# Patient Record
Sex: Female | Born: 1975 | Hispanic: Yes | Marital: Married | Smoking: Never smoker
Health system: Southern US, Community
[De-identification: ages and names within clinical notes are randomized; demographics above are authoritative.]

---

## 1998-02-14 ENCOUNTER — Encounter: Admission: RE | Admit: 1998-02-14 | Discharge: 1998-05-15 | Payer: Self-pay | Admitting: Gynecology

## 1998-07-22 ENCOUNTER — Inpatient Hospital Stay (HOSPITAL_COMMUNITY): Admission: AD | Admit: 1998-07-22 | Discharge: 1998-07-24 | Payer: Self-pay | Admitting: Gynecology

## 1999-05-04 ENCOUNTER — Inpatient Hospital Stay (HOSPITAL_COMMUNITY): Admission: AD | Admit: 1999-05-04 | Discharge: 1999-05-04 | Payer: Self-pay | Admitting: Gynecology

## 1999-08-28 ENCOUNTER — Other Ambulatory Visit: Admission: RE | Admit: 1999-08-28 | Discharge: 1999-08-28 | Payer: Self-pay | Admitting: Gynecology

## 1999-11-06 ENCOUNTER — Inpatient Hospital Stay (HOSPITAL_COMMUNITY): Admission: AD | Admit: 1999-11-06 | Discharge: 1999-11-08 | Payer: Self-pay | Admitting: Gynecology

## 1999-12-20 ENCOUNTER — Other Ambulatory Visit: Admission: RE | Admit: 1999-12-20 | Discharge: 1999-12-20 | Payer: Self-pay | Admitting: Gynecology

## 2001-01-29 ENCOUNTER — Other Ambulatory Visit: Admission: RE | Admit: 2001-01-29 | Discharge: 2001-01-29 | Payer: Self-pay | Admitting: Gynecology

## 2002-03-28 ENCOUNTER — Other Ambulatory Visit: Admission: RE | Admit: 2002-03-28 | Discharge: 2002-03-28 | Payer: Self-pay | Admitting: Gynecology

## 2003-03-30 ENCOUNTER — Other Ambulatory Visit: Admission: RE | Admit: 2003-03-30 | Discharge: 2003-03-30 | Payer: Self-pay | Admitting: Gynecology

## 2004-02-19 ENCOUNTER — Ambulatory Visit (HOSPITAL_COMMUNITY): Admission: RE | Admit: 2004-02-19 | Discharge: 2004-02-19 | Payer: Self-pay | Admitting: Gastroenterology

## 2004-02-19 ENCOUNTER — Encounter (INDEPENDENT_AMBULATORY_CARE_PROVIDER_SITE_OTHER): Payer: Self-pay | Admitting: *Deleted

## 2004-02-26 ENCOUNTER — Ambulatory Visit (HOSPITAL_COMMUNITY): Admission: RE | Admit: 2004-02-26 | Discharge: 2004-02-26 | Payer: Self-pay | Admitting: Gastroenterology

## 2004-05-01 ENCOUNTER — Other Ambulatory Visit: Admission: RE | Admit: 2004-05-01 | Discharge: 2004-05-01 | Payer: Self-pay | Admitting: Gynecology

## 2005-04-07 ENCOUNTER — Ambulatory Visit: Payer: Self-pay | Admitting: Family Medicine

## 2005-06-25 ENCOUNTER — Ambulatory Visit: Payer: Self-pay | Admitting: Family Medicine

## 2005-07-27 ENCOUNTER — Ambulatory Visit: Payer: Self-pay | Admitting: *Deleted

## 2006-10-13 ENCOUNTER — Ambulatory Visit: Payer: Self-pay | Admitting: Family Medicine

## 2006-10-13 ENCOUNTER — Encounter (INDEPENDENT_AMBULATORY_CARE_PROVIDER_SITE_OTHER): Payer: Self-pay | Admitting: Family Medicine

## 2007-06-02 ENCOUNTER — Encounter (INDEPENDENT_AMBULATORY_CARE_PROVIDER_SITE_OTHER): Payer: Self-pay | Admitting: *Deleted

## 2008-01-26 ENCOUNTER — Ambulatory Visit: Payer: Self-pay | Admitting: Internal Medicine

## 2008-06-21 ENCOUNTER — Ambulatory Visit: Payer: Self-pay | Admitting: Family Medicine

## 2008-07-20 ENCOUNTER — Ambulatory Visit: Payer: Self-pay | Admitting: Family Medicine

## 2008-08-29 ENCOUNTER — Ambulatory Visit: Payer: Self-pay | Admitting: Family Medicine

## 2008-09-19 ENCOUNTER — Ambulatory Visit: Payer: Self-pay | Admitting: Internal Medicine

## 2008-09-19 ENCOUNTER — Encounter (INDEPENDENT_AMBULATORY_CARE_PROVIDER_SITE_OTHER): Payer: Self-pay | Admitting: Family Medicine

## 2008-09-19 LAB — CONVERTED CEMR LAB
ALT: 23 units/L (ref 0–35)
AST: 27 units/L (ref 0–37)
Albumin: 4.3 g/dL (ref 3.5–5.2)
Alkaline Phosphatase: 58 units/L (ref 39–117)
BUN: 14 mg/dL (ref 6–23)
Eosinophils Absolute: 0.1 10*3/uL (ref 0.0–0.7)
Eosinophils Relative: 1 % (ref 0–5)
Glucose, Bld: 78 mg/dL (ref 70–99)
Hemoglobin: 12.1 g/dL (ref 12.0–15.0)
LDL Cholesterol: 126 mg/dL — ABNORMAL HIGH (ref 0–99)
Lymphocytes Relative: 24 % (ref 12–46)
Monocytes Absolute: 0.4 10*3/uL (ref 0.1–1.0)
Platelets: 214 10*3/uL (ref 150–400)
Potassium: 3.6 meq/L (ref 3.5–5.3)
RBC: 4.43 M/uL (ref 3.87–5.11)
RDW: 14.2 % (ref 11.5–15.5)
Sodium: 139 meq/L (ref 135–145)
Total Bilirubin: 0.4 mg/dL (ref 0.3–1.2)
VLDL: 10 mg/dL (ref 0–40)

## 2008-09-28 ENCOUNTER — Encounter (INDEPENDENT_AMBULATORY_CARE_PROVIDER_SITE_OTHER): Payer: Self-pay | Admitting: Family Medicine

## 2008-09-28 ENCOUNTER — Ambulatory Visit: Payer: Self-pay | Admitting: Family Medicine

## 2008-09-28 LAB — CONVERTED CEMR LAB
BUN: 12 mg/dL (ref 6–23)
Basophils Absolute: 0 10*3/uL (ref 0.0–0.1)
CO2: 23 meq/L (ref 19–32)
Chlamydia, DNA Probe: NEGATIVE
Creatinine, Ser: 0.46 mg/dL (ref 0.40–1.20)
Eosinophils Absolute: 0 10*3/uL (ref 0.0–0.7)
GC Probe Amp, Genital: NEGATIVE
Glucose, Bld: 86 mg/dL (ref 70–99)
HCT: 40 % (ref 36.0–46.0)
Hemoglobin: 12.5 g/dL (ref 12.0–15.0)
Lymphocytes Relative: 25 % (ref 12–46)
Lymphs Abs: 1.1 10*3/uL (ref 0.7–4.0)
MCHC: 31.3 g/dL (ref 30.0–36.0)
MCV: 86.2 fL (ref 78.0–100.0)
Neutrophils Relative %: 65 % (ref 43–77)
Potassium: 3.6 meq/L (ref 3.5–5.3)
RBC: 4.64 M/uL (ref 3.87–5.11)
Sodium: 140 meq/L (ref 135–145)
Total Protein: 7.4 g/dL (ref 6.0–8.3)
WBC: 4.2 10*3/uL (ref 4.0–10.5)

## 2008-10-06 ENCOUNTER — Ambulatory Visit (HOSPITAL_COMMUNITY): Admission: RE | Admit: 2008-10-06 | Discharge: 2008-10-06 | Payer: Self-pay | Admitting: Family Medicine

## 2008-11-02 ENCOUNTER — Ambulatory Visit: Payer: Self-pay | Admitting: Family Medicine

## 2009-02-01 ENCOUNTER — Ambulatory Visit: Payer: Self-pay | Admitting: Family Medicine

## 2009-04-22 IMAGING — US US TRANSVAGINAL NON-OB
1 series · 13 of 25 positions shown · non-contrast
Comparison: None

CLINICAL DATA: Pelvic pain.  LMP 09/13/2008

TRANSABDOMINAL AND TRANSVAGINAL ULTRASOUND OF PELVIS
TECHNIQUE: Both transabdominal and transvaginal ultrasound
examinations of the pelvis were performed including evaluation of
the uterus, ovaries, adnexal regions, and pelvic cul-de-sac.

[Series 1: unknown · 0.32mm/px · 13 of 49 slices shown]
[im 1/49]
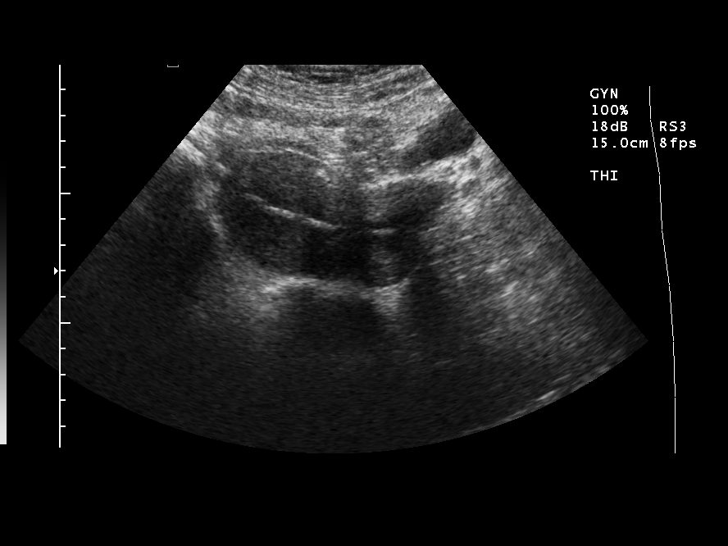
[im 5/49]
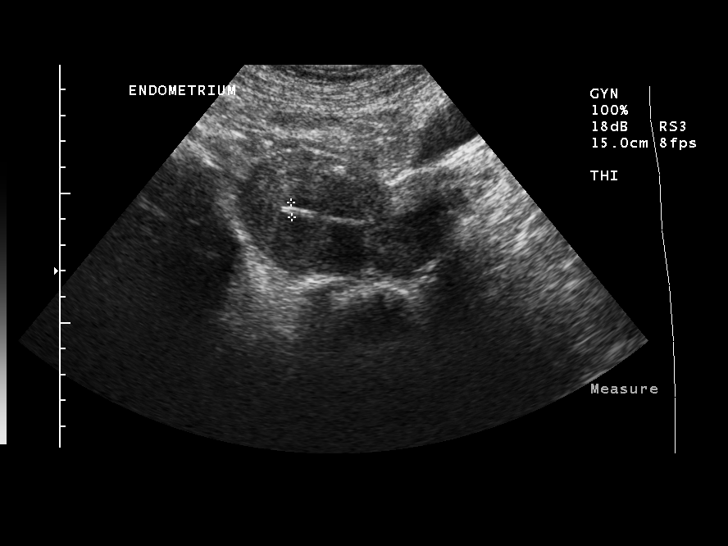
[im 9/49]
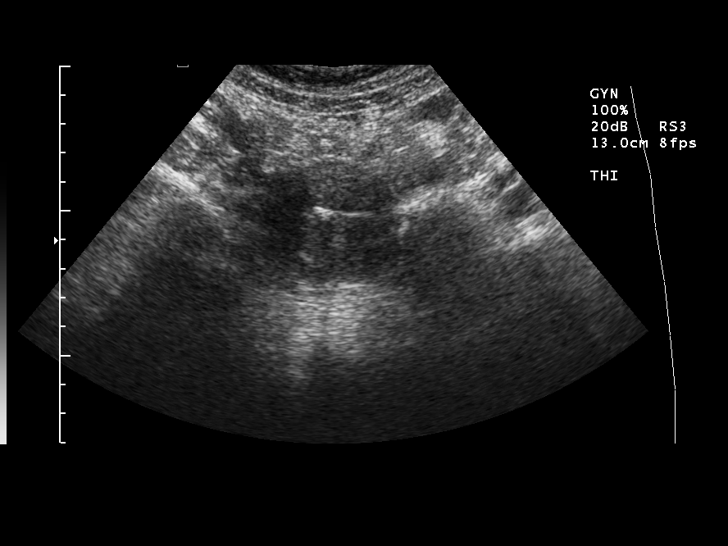
[im 13/49]
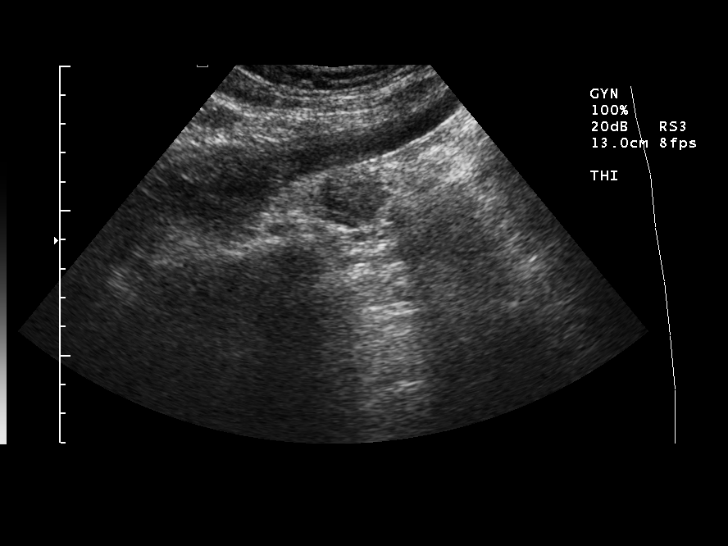
[im 17/49]
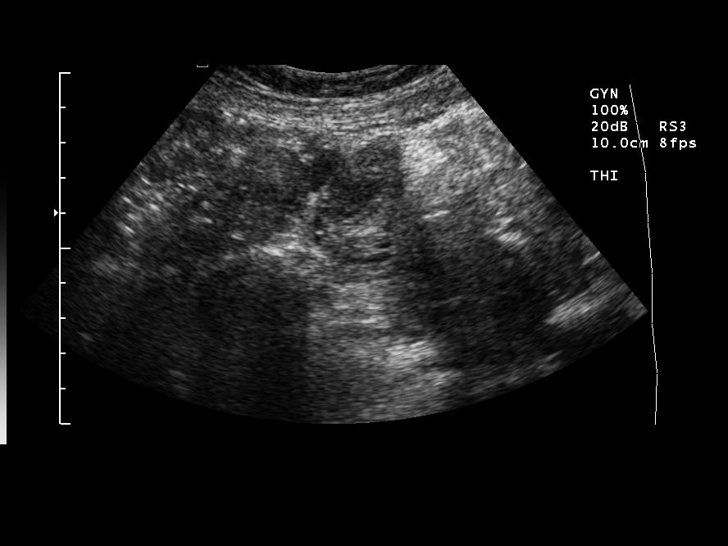
[im 21/49]
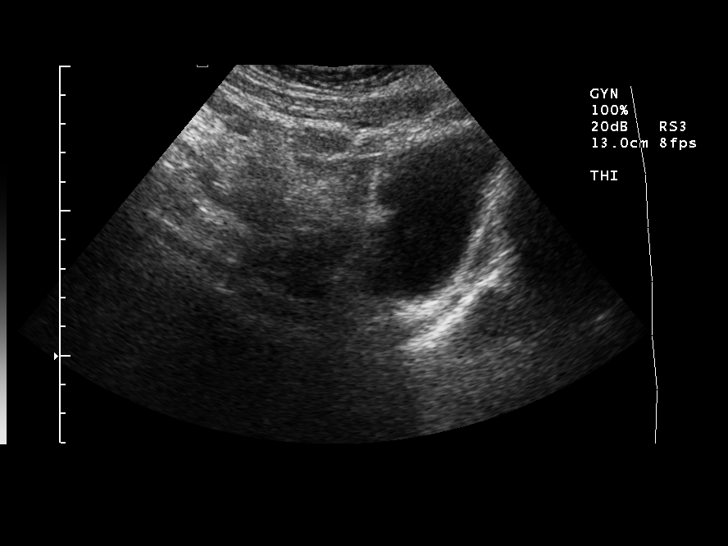
[im 25/49]
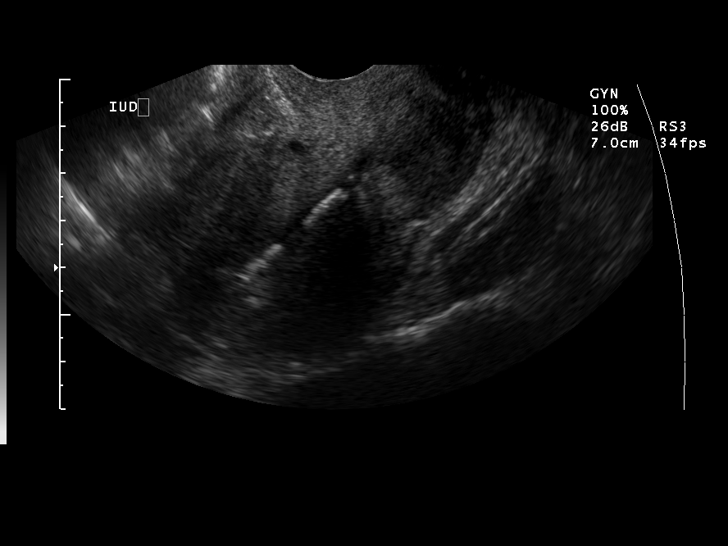
[im 29/49]
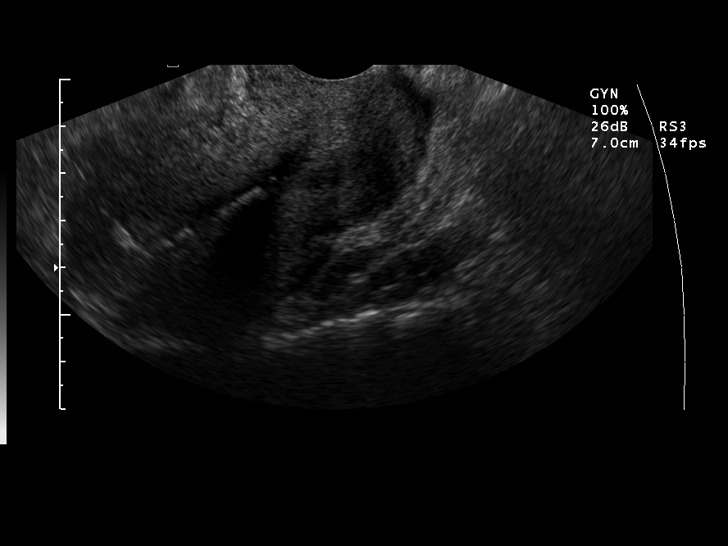
[im 33/49]
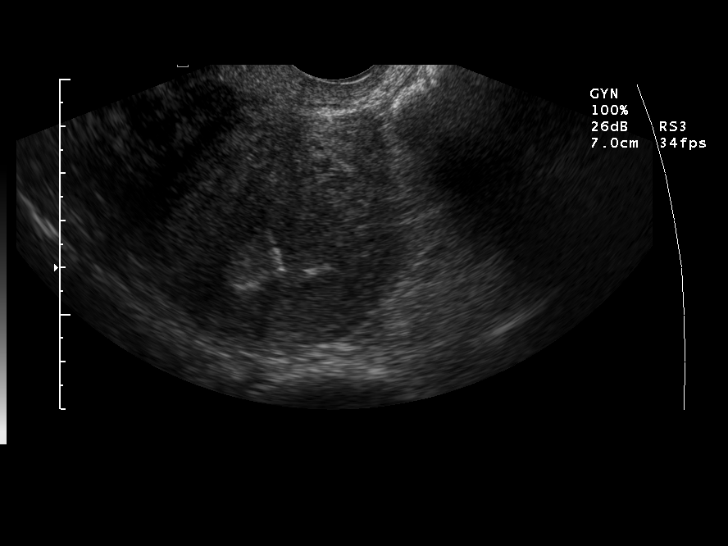
[im 37/49]
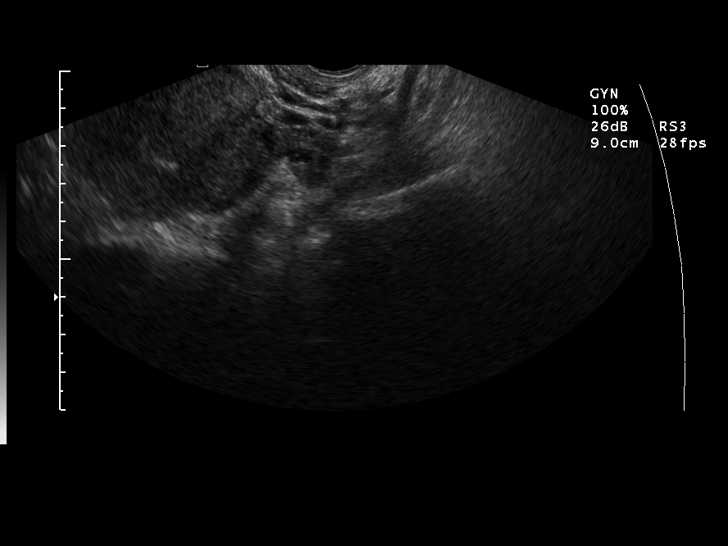
[im 41/49]
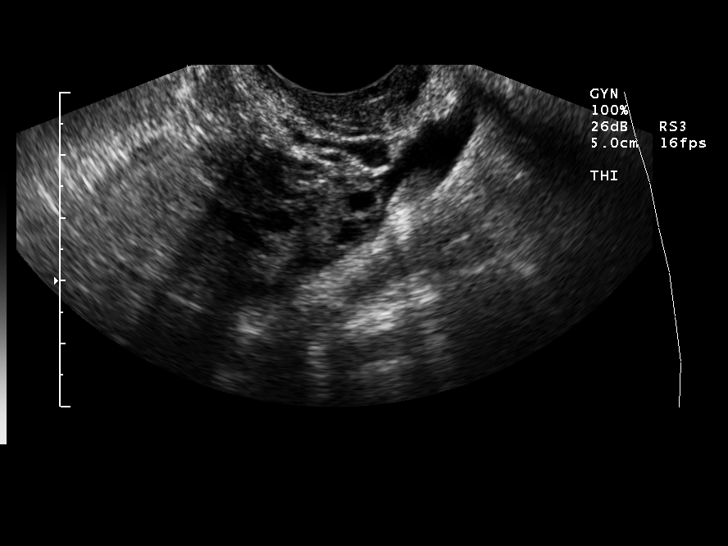
[im 45/49]
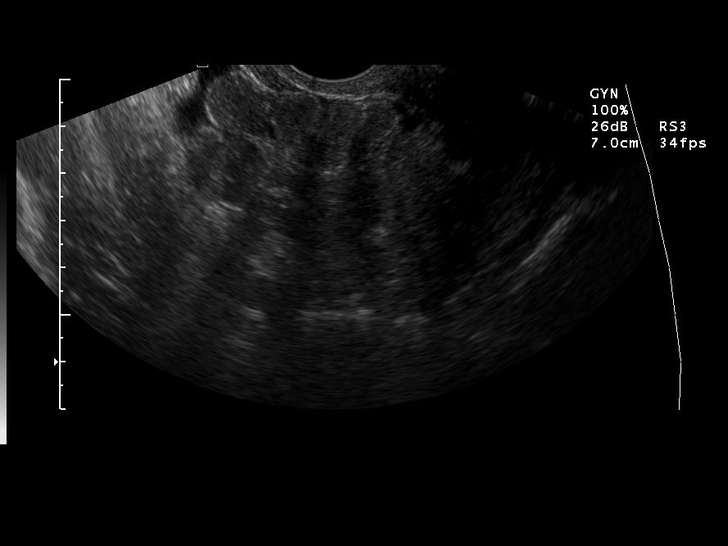
[im 49/49]
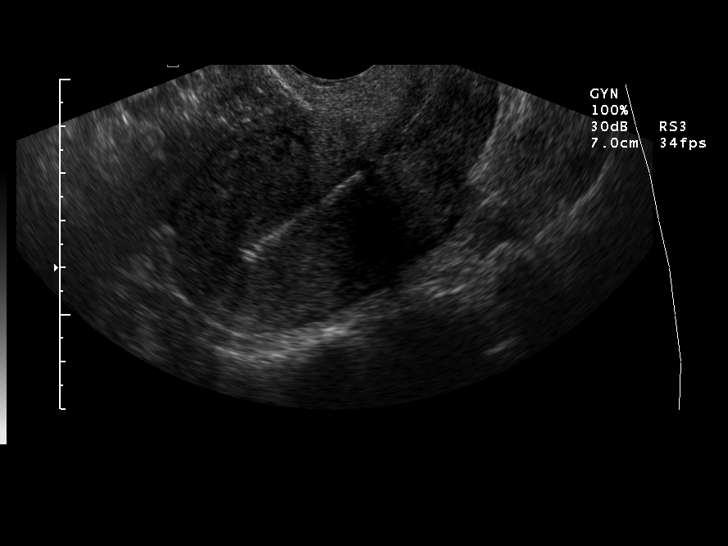

[13 of 25 positions shown; findings below may reference images not displayed]

FINDINGS: The uterus has a sagittal length of 8.2 cm, an AP width
of 4.8 cm and a transverse width of 5.8 cm.  A homogeneous uterine
myometrium is seen.  An IUD is in place within the endometrial
canal with the fundal portion of the IUD within 5 mm of the fundal
portion of the endometrial lining.  The endometrial lining is
incompletely assessed due to the presence of shadowing from the
IUD.  No definite areas of focal thickening however are noted.

Both ovaries have a normal appearance with the left ovary measuring
1.3 x 2.9 x 1.3 cm and the right ovary measuring 3.3 x 2.6 x
cm.  The right ovary is best visualized transabdominally.

A small amount of simple free fluid is identified in a left
paraovarian location.  No separate adnexal masses are seen.
IMPRESSION: IUD in place within the endometrial canal as described above.  Poor
endometrial evaluation due to the presence of the IUD.  Otherwise
normal pelvic ultrasound.

## 2009-06-14 ENCOUNTER — Ambulatory Visit: Payer: Self-pay | Admitting: Family Medicine

## 2010-02-05 ENCOUNTER — Ambulatory Visit: Payer: Self-pay | Admitting: Internal Medicine

## 2010-10-06 ENCOUNTER — Encounter: Payer: Self-pay | Admitting: Gastroenterology

## 2011-01-31 NOTE — Discharge Summary (Signed)
[facility]  Patient:    Jennifer Montoya, Jennifer Montoya                       MRN: [id number] Adm. Date:  [id number] Disc. Date: [id number] Attending:  Tonye Royalty Dictator:   Antony Contras, RNC, [facility]                           Discharge Summary  DISCHARGE DIAGNOSES:          1. Intrauterine pregnancy at 38-4/7 weeks.                               2. Normal spontaneous vaginal delivery of a viable                                  female infant.  HISTORY OF PRESENT ILLNESS:   The patient is a 35 year old, gravida 3, para 1, abortus 1, with an EDC of November 15, 1999.  Prenatal course was complicated by a Bartholins gland abscess which required incision and drainage and placement of  Ward catheter at approximately [redacted] weeks gestation.  Prenatal blood work was as follows.  PRENATAL LABORATORY DATA:     Blood type is A positive, antibody screen negative, rubella positive, MSAFP within normal limits, negative Group B Strep status.  HOSPITAL COURSE:              The patient presented in labor on November 06, 1999. On admission the cervix was 4 to 5 cm, 100% effaced.  Labor progressed to complete dilatation.  She was delivered of a viable female infant, Apgars 8 and 9 over midline episiotomy without extension.  Placenta intact with three vessel cord. Estimated blood loss was 300 cc.  Postpartum course was uneventful.  She remained afebrile with no problems voiding.  She was able to be discharged on her second  postpartum day with her infant in satisfactory condition.  DISPOSITION:                  She will follow up in the office in four to six weeks. She was discharged on Motrin p.r.n. pain and to continue with prenatal vitamins. DD:  11/26/99 TD:  11/26/99 Job: [id number]

## 2011-01-31 NOTE — Op Note (Signed)
NAME:  Jennifer Montoya, Jennifer Montoya                          ACCOUNT NO.:  [id number]   MEDICAL RECORD NO.:  [id number]                   PATIENT TYPE:  AMB   LOCATION:  ENDO                                 FACILITY:  [facility]   PHYSICIAN:  Anselmo Rod, M.D.               DATE OF BIRTH:  1976/03/11   DATE OF PROCEDURE:  02/19/2004  DATE OF DISCHARGE:                                 OPERATIVE REPORT   PROCEDURE:  Esophagogastroduodenoscopy with biopsy.   ENDOSCOPIST:  Charna Elizabeth, M.D.   INSTRUMENT USED:  Olympus video panendoscope.   INDICATIONS FOR PROCEDURE:  35 year old Hispanic female with a history of  epigastric pain and hematemesis.  Rule out peptic ulcer disease, AVMs, or  Dieulafoy's lesions.   PREPROCEDURE PREPARATION:  Informed consent was obtained from the patient.  The patient was fasted for eight hours prior to the procedure.   PREPROCEDURE PHYSICAL:  Patient with stable vital signs.  Neck supple.  Chest clear to auscultation.  S1 and S2 regular.  Abdomen soft with normal  bowel sounds.   DESCRIPTION OF PROCEDURE:  The patient was placed in the left lateral  decubitus position, sedated with 75 mg of Demerol and 7.5 mg Versed  intravenously.  Once the patient was adequately sedated, maintained on low  flow oxygen and continuous cardiac monitoring, the Olympus video  panendoscope was advanced through the mouth piece over the tongue into the  esophagus under direct vision.  The entire esophagus appeared normal with no  evidence of ring, strictures, masses, esophagitis, or Barrett's mucosa.  The  scope was then advanced into the stomach.  Hemorrhagic gastritis was noted  in the proximal stomach with old heme and biopsies were done to rule out the  presence of H. pylori pathology.  The mid body and the antrum appeared  normal.  The proximal was essentially normal except for a prominent ampulla  and a small prominent fold near the ampulla that was biopsied x 1.  This  bled easily  and, therefore, no further biopsies were done.  There was no  outlet obstruction.  The patient tolerated the procedure well without  complications.  No definite ulcers, AVMs, masses, or polyps were seen.   IMPRESSION:  1. Hemorrhagic gastritis in the proximal half of the stomach, biopsies done.  2. 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  3. Normal appearing esophagus.  4. No ulcers, erosions, masses, or polyps seen, no evidence of AVMs or     Dieulafoy's lesions.   RECOMMENDATIONS:  1. Upper GI with small bowel follow through has been recommended.  2. Await pathology results, treat with antibiotics is H. pylori present on     biopsy.  3. Avoid nonsteroidals for now.  4. Continue PPIs.  5. Outpatient follow up in the next two weeks, earlier if need be.  Anselmo Rod, M.D.    JNM/MEDQ  D:  02/19/2004  T:  02/19/2004  Job:  [id number]   cc:   Gabriel Earing, M.D.  [address]  [place]  [place] [address]  Fax: [phone]

## 2019-09-07 ENCOUNTER — Ambulatory Visit: Payer: Self-pay | Admitting: Women's Health

## 2019-09-26 ENCOUNTER — Other Ambulatory Visit: Payer: Self-pay

## 2019-09-27 ENCOUNTER — Ambulatory Visit (INDEPENDENT_AMBULATORY_CARE_PROVIDER_SITE_OTHER): Payer: Self-pay | Admitting: Women's Health

## 2019-09-27 ENCOUNTER — Encounter: Payer: Self-pay | Admitting: Women's Health

## 2019-09-27 VITALS — BP 118/76 | Ht 64.0 in | Wt 132.0 lb

## 2019-09-27 DIAGNOSIS — N938 Other specified abnormal uterine and vaginal bleeding: Secondary | ICD-10-CM

## 2019-09-27 DIAGNOSIS — N898 Other specified noninflammatory disorders of vagina: Secondary | ICD-10-CM

## 2019-09-27 DIAGNOSIS — Z113 Encounter for screening for infections with a predominantly sexual mode of transmission: Secondary | ICD-10-CM

## 2019-09-27 DIAGNOSIS — A599 Trichomoniasis, unspecified: Secondary | ICD-10-CM

## 2019-09-27 DIAGNOSIS — Z124 Encounter for screening for malignant neoplasm of cervix: Secondary | ICD-10-CM

## 2019-09-27 DIAGNOSIS — Z01419 Encounter for gynecological examination (general) (routine) without abnormal findings: Secondary | ICD-10-CM

## 2019-09-27 LAB — WET PREP FOR TRICH, YEAST, CLUE

## 2019-09-27 MED ORDER — METRONIDAZOLE 500 MG PO TABS: ORAL_TABLET | ORAL | 0 refills | Status: DC

## 2019-09-27 NOTE — Progress Notes (Signed)
Jennifer Montoya 01/08/1976 [id number]  Spanish interpretor.    History: 44 yo MHF G3P2 Presents for annual exam and vaginal discharge, itching, redness x2 weeks. LMP 09/06/19-09/14/19, new cycle began today. Sexually active with husband x23 years with no contraception, no new partner and husband denies infidelity. Patient and husband diagnosed with trichomonas and gonorrhea 04/2019 at [facility] department and both received treatment.  Negative HIV, hepatitis and syphilis screen does not use condoms. Ectopic pregnancy >10 yrs ago.  Last pap 2017 at health department, reports history of normal paps. No mammogram. No medical problems.   Past medical history, past surgical history, family history and social history were all reviewed and documented in the EPIC chart. 2 healthy children. Sister lives in area, parents live in [place]. Works at [employer]. Feels safe at home.  ROS:  A ROS was performed and pertinent positives and negatives are included.  Exam:  Vitals:   09/27/19 1359  BP: 118/76  Weight: 132 lb (59.9 kg)  Height: 5\' 4"  (1.626 m)   Body mass index is 22.66 kg/m.   General appearance:  Normal Thyroid:  Symmetrical, normal in size, without palpable masses or nodularity. Respiratory  Auscultation:  Clear without wheezing or rhonchi Cardiovascular  Auscultation:  Regular rate, without rubs, murmurs or gallops  Edema/varicosities:  Not grossly evident Abdominal  Soft,nontender, without masses, guarding or rebound.  Liver/spleen:  No organomegaly noted  Hernia:  None appreciated  Skin  Inspection:  Grossly normal   Breasts: Examined lying and sitting.     Right: Without masses, retractions, discharge or axillary adenopathy.     Left: Without masses, retractions, discharge or axillary adenopathy. Gentitourinary   Inguinal/mons:  Normal without inguinal adenopathy  External genitalia:  Normal  BUS/Urethra/Skene's glands:  Normal  Vagina:  Visible menses. Wet prep positive for  trichomonas.   Cervix:  Normal, no CMT  Uterus:  normal in size, shape and contour.  Midline and mobile  Adnexa/parametria:     Rt: Without masses or tenderness.   Lt: Without masses or tenderness.  Anus and perineum: Normal    Assessment/Plan:  44 y.o. MHF G3P2  for annual exam and vaginal discharge, itching, redness.  Trichomonas  STD Screen  Monthly cycle- no contraception  Plan: Metronidazole 500mg  8 tablets, 4 tablets each for her and husband, reviewed alcohol precautions.  Abstain for 1 week and call or return if continued vaginal irritation.  Pap, GC/chlamydia. Reviewed contraception options, does not desire at this time, no pregnancy in past 4 years with no contraception. Encouraged daily exercise, healthy diet, and daily MVI. Informed likelihood that husband was unfaithful, recommended counseling. Given mammogram scholarship paperwork with instructions. Instructed to call if symptoms persist.  Pap.  Requested minimum no insurance    [facility], 2:57 PM 09/27/2019

## 2019-09-27 NOTE — Addendum Note (Signed)
Addended by: Dayna Barker on: 09/27/2019 04:13 PM   Modules accepted: Orders

## 2019-09-27 NOTE — Patient Instructions (Signed)
Mantenimiento de la salud en las mujeres Health Maintenance, Female Adoptar un estilo de vida saludable y recibir atencin preventiva son importantes para promover la salud y el bienestar. Consulte al mdico sobre:  El esquema adecuado para hacerse pruebas y exmenes peridicos.  Cosas que puede hacer por su cuenta para prevenir enfermedades y mantenerse sana. Qu debo saber sobre la dieta, el peso y el ejercicio? Consuma una dieta saludable   Consuma una dieta que incluya muchas verduras, frutas, productos lcteos con bajo contenido de grasa y protenas magras.  No consuma muchos alimentos ricos en grasas slidas, azcares agregados o sodio. Mantenga un peso saludable El ndice de masa muscular (IMC) se utiliza para identificar problemas de peso. Proporciona una estimacin de la grasa corporal basndose en el peso y la altura. Su mdico puede ayudarle a determinar su IMC y a lograr o mantener un peso saludable. Haga ejercicio con regularidad Haga ejercicio con regularidad. Esta es una de las prcticas ms importantes que puede hacer por su salud. La mayora de los adultos deben seguir estas pautas:  Realizar, al menos, 150minutos de actividad fsica por semana. El ejercicio debe aumentar la frecuencia cardaca y hacerlo transpirar (ejercicio de intensidad moderada).  Hacer ejercicios de fortalecimiento por lo menos dos veces por semana. Agregue esto a su plan de ejercicio de intensidad moderada.  Pasar menos tiempo sentados. Incluso la actividad fsica ligera puede ser beneficiosa. Controle sus niveles de colesterol y lpidos en la sangre Comience a realizarse anlisis de lpidos y colesterol en la sangre a los 20aos y luego reptalos cada 5aos. Hgase controlar los niveles de colesterol con mayor frecuencia si:  Sus niveles de lpidos y colesterol son altos.  Es mayor de 40aos.  Presenta un alto riesgo de padecer enfermedades cardacas. Qu debo saber sobre las pruebas de  deteccin del cncer? Segn su historia clnica y sus antecedentes familiares, es posible que deba realizarse pruebas de deteccin del cncer en diferentes edades. Esto puede incluir pruebas de deteccin de lo siguiente:  Cncer de mama.  Cncer de cuello uterino.  Cncer colorrectal.  Cncer de piel.  Cncer de pulmn. Qu debo saber sobre la enfermedad cardaca, la diabetes y la hipertensin arterial? Presin arterial y enfermedad cardaca  La hipertensin arterial causa enfermedades cardacas y aumenta el riesgo de accidente cerebrovascular. Es ms probable que esto se manifieste en las personas que tienen lecturas de presin arterial alta, tienen ascendencia africana o tienen sobrepeso.  Hgase controlar la presin arterial: ? Cada 3 a 5 aos si tiene entre 18 y 39 aos. ? Todos los aos si es mayor de 40aos. Diabetes Realcese exmenes de deteccin de la diabetes con regularidad. Este anlisis revisa el nivel de azcar en la sangre en ayunas. Hgase las pruebas de deteccin:  Cada tresaos despus de los 40aos de edad si tiene un peso normal y un bajo riesgo de padecer diabetes.  Con ms frecuencia y a partir de una edad inferior si tiene sobrepeso o un alto riesgo de padecer diabetes. Qu debo saber sobre la prevencin de infecciones? Hepatitis B Si tiene un riesgo ms alto de contraer hepatitis B, debe someterse a un examen de deteccin de este virus. Hable con el mdico para averiguar si tiene riesgo de contraer la infeccin por hepatitis B. Hepatitis C Se recomienda el anlisis a:  Todos los que nacieron entre 1945 y 1965.  Todas las personas que tengan un riesgo de haber contrado hepatitis C. Enfermedades de transmisin sexual (ETS)  Hgase las   pruebas de deteccin de ITS, incluidas la gonorrea y la clamidia, si: ? Es sexualmente activa y es menor de 62IRS. ? Es mayor de 24aos, y Public affairs consultant informa que corre riesgo de tener este tipo de infecciones. ?  La actividad sexual ha cambiado desde que le hicieron la ltima prueba de deteccin y tiene un riesgo mayor de Warehouse manager clamidia o Copy. Pregntele al mdico si usted tiene riesgo.  Pregntele al mdico si usted tiene un alto riesgo de Primary school teacher VIH. El mdico tambin puede recomendarle un medicamento recetado para ayudar a evitar la infeccin por el VIH. Si elige tomar medicamentos para prevenir el VIH, primero debe [employer] de deteccin del VIH. Luego debe hacerse anlisis cada mientras est tomando los medicamentos. Embarazo  Si est por dejar de Armed forces training and education officer (fase premenopusica) y usted puede quedar [place], busque asesoramiento antes de [place].  Tome de 400 a (mcg) de cido [employer] si [place].  Pida mtodos de control de la natalidad (anticonceptivos) si desea evitar un embarazo no deseado. Osteoporosis y [place] La osteoporosis es una enfermedad en la que los huesos pierden los minerales y la fuerza por el avance de la edad. El resultado pueden ser fracturas en los [place]. Si tiene 65aos o ms, o si est en riesgo de sufrir osteoporosis y fracturas, pregunte a su mdico si debe:  Hacerse pruebas de deteccin de prdida sea.  Tomar un suplemento de calcio o de vitamina D para reducir el riesgo de fracturas.  Recibir terapia de reemplazo hormonal (TRH) para tratar los sntomas de la menopausia. Siga estas instrucciones en su casa: Estilo de vida  No consuma ningn producto que contenga nicotina o tabaco, como cigarrillos, cigarrillos electrnicos y tabaco de Theatre manager. Si necesita ayuda para dejar de fumar, consulte al mdico.  No consuma drogas.  No comparta agujas.  Solicite ayuda a su mdico si necesita apoyo o informacin para abandonar las drogas. Consumo de alcohol  No beba alcohol si: ? Su mdico le indica no hacerlo. ? Est embarazada, puede estar embarazada o est tratando de quedar embarazada.   Si bebe alcohol: ? Limite la cantidad que consume de 0 a 1 medida por da. ? Limite la ingesta si est amamantando.  Est atento a la cantidad de alcohol que hay en las bebidas que toma. En los [place], una medida equivale a una botella de cerveza de 12oz ( ), un vaso de vino de 5oz ( ) o un vaso de una bebida alcohlica de alta graduacin de 1oz (20ml). Instrucciones generales  Realcese los estudios de rutina de la salud, dentales y de Wellsite geologist.  Mantngase al da con las vacunas.  Infrmele a su mdico si: ? Se siente deprimida con frecuencia. ? Alguna vez ha sido vctima de [place] o no se siente segura en su casa. Resumen  Adoptar un estilo de vida saludable y recibir atencin preventiva son importantes para promover la salud y Counsellor.  Siga las instrucciones del mdico acerca de una dieta saludable, el ejercicio y la realizacin de pruebas o exmenes para Hotel manager.  Siga las instrucciones del mdico con respecto al control del colesterol y la presin arterial. Esta informacin no tiene Theme park manager el consejo del mdico. Asegrese de hacerle al mdico cualquier pregunta que tenga. Document Revised: 09/22/2018 Document Reviewed: 09/22/2018 Elsevier Patient Education  2020 [employer]. Tricomoniasis Trichomoniasis La tricomoniasis es una ITS (infeccin de transmisin sexual) que puede afectar tanto a mujeres como  a hombres. En las mujeres, afecta la zona externa de los genitales femeninos (vulva) y la vagina. En los hombres, afecta principalmente al pene, pero tambin pueden estar involucrados la prstata y otros rganos genitales.  Esta afeccin puede tratarse con medicamentos. No suele causar sntomas (es asintomtica), especialmente en los hombres. Si no se trata, la tricomoniasis puede durar meses o aos. Cules son las causas? Esta afeccin es causada por un parsito llamado tricomona vaginal. Es frecuente que la tricomoniasis  se transmita de Ardelia Mems persona a la otra (es contagiosa) a travs del contacto sexual. Sander Nephew incrementa el riesgo? Los siguientes factores pueden hacer que sea ms propensa a desarrollar esta afeccin:  Armed forces operational officer sexuales sin proteccin.  Tener relaciones sexuales con una persona que tenga tricomoniasis.  Tener mltiples parejas sexuales.  Haber tenido infecciones anteriores por tricomoniasis u otras infecciones de transmisin sexual (ITS). Cules son los signos o los sntomas? En las mujeres, los sntomas de tricomoniasis incluyen lo siguiente:  Secrecin vaginal anmala que es transparente o de color blanco, gris o amarillo verdoso, con espuma y un olor ftido inusual.  Picazn e irritacin en la vagina y la vulva.  Dolor o ardor al Garment/textile technologist o al [employer].  Enrojecimiento e hinchazn de los genitales. En los hombres, los sntomas de tricomoniasis incluyen lo siguiente:  Soil scientist del pene que puede tener una espuma o pus.  Dolor de pene. Esto puede suceder solamente al [employer].  Picazn o irritacin dentro del pene.  Ardor despus de Garment/textile technologist o de Market researcher. Cmo se diagnostica? En las mujeres, esta afeccin puede diagnosticarse durante una prueba de Papanicolaou o un examen fsico de rutina. En los hombres, durante un examen fsico de [place]. El mdico puede realizarle pruebas como ayuda para diagnosticar esta infeccin, por ejemplo, las siguientes:  Anlisis de [place] (hombres y [place]).  Lo siguiente en las mujeres: ? Anlisis del pH de la vagina. ? Un hisopado vaginal para detectar la presencia del parsito tricomona vaginal. ? Pruebas de las secreciones vaginales. El mdico puede hacerle pruebas de deteccin de otras ITS, incluido el VIH (virus de inmunodeficiencia [place]). Cmo se trata? Para tratar esta afeccin, se toma un medicamento (por va oral), por ejemplo, metronidazol o tinidazol, que combate la infeccin. Su(s) pareja(s) sexual(es)  tambin debe(n) realizarse pruebas y recibir tratamiento.  Si usted es mujer y tiene planes de quedar embarazada o cree que puede estarlo, informe al mdico de inmediato. Algunos medicamentos utilizados para tratar la infeccin no deben tomarse Solicitor. El mdico puede recomendarle medicamentos o cremas de venta libre para Public house manager la picazn o la irritacin. Pueden repetirle las pruebas de deteccin de infecciones 15meses despus del tratamiento. Siga estas instrucciones en su casa:  Tome y use los medicamentos de venta libre y recetados, incluidas las cremas, Child psychotherapist se lo haya indicado el [place] su antibitico como se lo haya indicado el mdico. No deje de tomar el antibitico aunque comience a sentirse mejor.  No tenga relaciones sexuales durante un plazo de 7 a 10 das despus de haber terminado el medicamento o hasta que el mdico lo apruebe. Pregntele al mdico cundo puede comenzar a Office manager sexuales nuevamente.  (Mujeres) No se haga duchas vaginales ni use tampones mientras tenga la infeccin.  Hable sobre la infeccin con su(s) pareja(s) sexual(es). Asegrese de que su pareja se haga examinar y se trate, si es necesario.  Concurra a todas las visitas de seguimiento como se lo haya indicado el mdico. Esto  es importante. Cmo se evita?   Use preservativos cada vez que tenga relaciones sexuales. El uso correcto y sistemtico de los preservativos puede ayudar a Health visitor las ITS.  Evite tener mltiples parejas sexuales.  Hable con su pareja sexual acerca de los sntomas que cualquiera de [address], as como de cualquier antecedente de ITS.  Hgase pruebas de deteccin de ITS y ETS (enfermedades de transmisin sexual) antes de Management consultant. Pdale a su pareja que haga lo mismo.  No tenga contacto sexual si presenta sntomas de tricomoniasis o de otra ITS. Comunquese con un mdico si:  An tiene sntomas despus  de finalizados los medicamentos.  Siente dolor en el abdomen.  Siente dolor al [employer].  Tiene sangrado despus de Management consultant.  Presenta una erupcin cutnea.  Siente nuseas o vomita.  Tiene planes de quedar embarazada o cree que podra estarlo. Resumen  La tricomoniasis es una ITS (infeccin de transmisin sexual) que puede afectar tanto a mujeres como a hombres.  La afeccin no suele causar sntomas (es asintomtica), especialmente en los hombres.  Sin tratamiento, esta afeccin puede durar meses o aos.  No debe Producer, television/film/video un plazo de 7 a 10 das despus de haber terminado el medicamento o hasta que el mdico lo apruebe. Pregntele al mdico cundo puede comenzar a Child psychotherapist sexuales nuevamente.  Hable sobre la infeccin con su(s) pareja(s) sexual(es). Asegrese de que su pareja se haga examinar y se trate, si es necesario. Esta informacin no tiene Theme park manager el consejo del mdico. Asegrese de hacerle al mdico cualquier pregunta que tenga. Document Revised: 08/05/2018 Document Reviewed: 08/05/2018 Elsevier Patient Education  2020 [employer].

## 2019-09-28 LAB — PAP THINPREP ASCUS RFLX HPV RFLX TYPE
C. trachomatis RNA, TMA: NOT DETECTED
N. gonorrhoeae RNA, TMA: NOT DETECTED

## 2019-10-11 ENCOUNTER — Telehealth: Payer: Self-pay

## 2019-10-11 NOTE — Telephone Encounter (Signed)
Please call and review since the irritation is no longer a problem could watch at this time.  Make sure that both she and her husband were treated for the trichomonas with Flagyl.  Does she feel that the infection is totally resolved?  Does she think she needs a refill of the Flagyl?  If so okay to repeat Flagyl 500 mg twice daily for 7 days #14 alcohol precautions.

## 2019-10-11 NOTE — Telephone Encounter (Signed)
Spanish speaking patient. Note from Claudia:  "Patient came to see [place] on Jan 12 [place] gave her RX for a discharge that she had and pt states she still has a clear discharge. She no longer has the itching or redness but constant discharge. Pt wants to know if [place] can prescribe something for that."

## 2019-10-12 MED ORDER — METRONIDAZOLE 500 MG PO TABS: ORAL_TABLET | ORAL | 0 refills | Status: DC

## 2019-10-12 NOTE — Telephone Encounter (Signed)
Jennifer Montoya spoke with patient and relayed [place]'s message. Jennifer Montoya wrote "She would like RX to be sent for piece of mind. thx "  Rx sent.

## 2019-11-09 ENCOUNTER — Encounter: Payer: Self-pay | Admitting: Women's Health

## 2019-11-09 ENCOUNTER — Telehealth: Payer: Self-pay | Admitting: *Deleted

## 2019-11-09 ENCOUNTER — Ambulatory Visit (INDEPENDENT_AMBULATORY_CARE_PROVIDER_SITE_OTHER): Payer: Self-pay | Admitting: Women's Health

## 2019-11-09 ENCOUNTER — Other Ambulatory Visit: Payer: Self-pay

## 2019-11-09 VITALS — BP 124/80

## 2019-11-09 DIAGNOSIS — N898 Other specified noninflammatory disorders of vagina: Secondary | ICD-10-CM

## 2019-11-09 LAB — WET PREP FOR TRICH, YEAST, CLUE

## 2019-11-09 MED ORDER — METRONIDAZOLE 500 MG PO TABS: ORAL_TABLET | ORAL | 1 refills | Status: DC

## 2019-11-09 NOTE — Telephone Encounter (Signed)
Patient called and spoke with Debarah Crape in spanish stating the vaginal discharge has returned as noted on OV on 08/26/20, asked if refill on flagyl 500 mg tablet can be provided? Patient has been sexually active with husband. Please advise

## 2019-11-09 NOTE — Telephone Encounter (Signed)
Please call and ask if both she and her husband took the Flagyl medication for the trichomonas infection?  They both should have taken 4 tablets of the medication each, if they did not I will E scribe the Flagyl again.  Thank you for your help

## 2019-11-09 NOTE — Telephone Encounter (Signed)
will route to [place] to relay.

## 2019-11-09 NOTE — Patient Instructions (Addendum)
usted y su esposo tomen 4 pastillas hoy y evite el alcohl. Despues 2 X dia por 5 dias. Rellene su medecina, deveria costar $4  Tricomoniasis Trichomoniasis La tricomoniasis es una ITS (infeccin de transmisin sexual) que puede afectar tanto a mujeres como a hombres. En las mujeres, afecta la zona externa de los genitales femeninos (vulva) y la vagina. En los hombres, afecta principalmente al pene, pero tambin pueden estar involucrados la prstata y otros rganos genitales.  Esta afeccin puede tratarse con medicamentos. No suele causar sntomas (es asintomtica), especialmente en los hombres. Si no se trata, la tricomoniasis puede durar meses o aos. Cules son las causas? Esta afeccin es causada por un parsito llamado tricomona vaginal. Es frecuente que la tricomoniasis se transmita de Ardelia Mems persona a la otra (es contagiosa) a travs del contacto sexual. Sander Nephew incrementa el riesgo? Los siguientes factores pueden hacer que sea ms propensa a desarrollar esta afeccin:  Armed forces operational officer sexuales sin proteccin.  Tener relaciones sexuales con una persona que tenga tricomoniasis.  Tener mltiples parejas sexuales.  Haber tenido infecciones anteriores por tricomoniasis u otras infecciones de transmisin sexual (ITS). Cules son los signos o los sntomas? En las mujeres, los sntomas de tricomoniasis incluyen lo siguiente:  Secrecin vaginal anmala que es transparente o de color blanco, gris o amarillo verdoso, con espuma y un olor ftido inusual.  Picazn e irritacin en la vagina y la vulva.  Dolor o ardor al Garment/textile technologist o al [employer].  Enrojecimiento e hinchazn de los genitales. En los hombres, los sntomas de tricomoniasis incluyen lo siguiente:  Soil scientist del pene que puede tener una espuma o pus.  Dolor de pene. Esto puede suceder solamente al [employer].  Picazn o irritacin dentro del pene.  Ardor despus de Garment/textile technologist o de Market researcher. Cmo se diagnostica? En las  mujeres, esta afeccin puede diagnosticarse durante una prueba de Papanicolaou o un examen fsico de rutina. En los hombres, durante un examen fsico de [place]. El mdico puede realizarle pruebas como ayuda para diagnosticar esta infeccin, por ejemplo, las siguientes:  Anlisis de [place] (hombres y [place]).  Lo siguiente en las mujeres: ? Anlisis del pH de la vagina. ? Un hisopado vaginal para detectar la presencia del parsito tricomona vaginal. ? Pruebas de las secreciones vaginales. El mdico puede hacerle pruebas de deteccin de otras ITS, incluido el VIH (virus de inmunodeficiencia [place]). Cmo se trata? Para tratar esta afeccin, se toma un medicamento (por va oral), por ejemplo, metronidazol o tinidazol, que combate la infeccin. Su(s) pareja(s) sexual(es) tambin debe(n) realizarse pruebas y recibir tratamiento.  Si usted es mujer y tiene planes de quedar embarazada o cree que puede estarlo, informe al mdico de inmediato. Algunos medicamentos utilizados para tratar la infeccin no deben tomarse Solicitor. El mdico puede recomendarle medicamentos o cremas de venta libre para Public house manager la picazn o la irritacin. Pueden repetirle las pruebas de deteccin de infecciones 60meses despus del tratamiento. Siga estas instrucciones en su casa:  Tome y use los medicamentos de venta libre y recetados, incluidas las cremas, Child psychotherapist se lo haya indicado el [place] su antibitico como se lo haya indicado el mdico. No deje de tomar el antibitico aunque comience a sentirse mejor.  No tenga relaciones sexuales durante un plazo de 7 a 10 das despus de haber terminado el medicamento o hasta que el mdico lo apruebe. Pregntele al mdico cundo puede comenzar a Office manager sexuales nuevamente.  (Mujeres) No se haga duchas vaginales ni use tampones  mientras tenga la infeccin.  Hable sobre la infeccin con su(s) pareja(s) sexual(es). Asegrese de que su pareja se haga  examinar y se trate, si es necesario.  Concurra a todas las visitas de [address] se lo haya indicado el mdico. Esto es importante. Cmo se evita?   Use preservativos cada vez que tenga relaciones sexuales. El uso correcto y sistemtico de los preservativos puede ayudar a Health visitor las ITS.  Evite tener mltiples parejas sexuales.  Hable con su pareja sexual acerca de los sntomas que cualquiera de [address], as como de cualquier antecedente de ITS.  Hgase pruebas de deteccin de ITS y ETS (enfermedades de transmisin sexual) antes de Management consultant. Pdale a su pareja que haga lo mismo.  No tenga contacto sexual si presenta sntomas de tricomoniasis o de otra ITS. Comunquese con un mdico si:  An tiene sntomas despus de finalizados los medicamentos.  Siente dolor en el abdomen.  Siente dolor al [employer].  Tiene sangrado despus de Management consultant.  Presenta una erupcin cutnea.  Siente nuseas o vomita.  Tiene planes de quedar embarazada o cree que podra estarlo. Resumen  La tricomoniasis es una ITS (infeccin de transmisin sexual) que puede afectar tanto a mujeres como a hombres.  La afeccin no suele causar sntomas (es asintomtica), especialmente en los hombres.  Sin tratamiento, esta afeccin puede durar meses o aos.  No debe Producer, television/film/video un plazo de 7 a 10 das despus de haber terminado el medicamento o hasta que el mdico lo apruebe. Pregntele al mdico cundo puede comenzar a Child psychotherapist sexuales nuevamente.  Hable sobre la infeccin con su(s) pareja(s) sexual(es). Asegrese de que su pareja se haga examinar y se trate, si es necesario. Esta informacin no tiene Theme park manager el consejo del mdico. Asegrese de hacerle al mdico cualquier pregunta que tenga. Document Revised: 08/05/2018 Document Reviewed: 08/05/2018 Elsevier Patient Education  2020 [employer].

## 2019-11-09 NOTE — Progress Notes (Signed)
44 yo MHF G3P2 presents with complaint of yellow vaginal discharge and itching for 3 days. Was previously seen on 09/27/19 for a similar complaint and was diagnosed with trichomonas. Was prescribed Metronidazole 500 mg 8 tablets, 4 tablets each for her and her husband. States she took the medication, but her husband did not take his medication. Believes he passed the infection back to her. A translator was present for this visit and translated for the patient.  Monthly cycle no contraception.  No medical problems.  Works at [employer].  Exam: Appears comfortable on exam table. External genitalia erythematous. Speculum exam reveals yellow, frothy discharge. Wet prep positive for trichomonas.   Trichomonas   Plan: Prescribed metronidazole 500 mg, take 4 pills today and then one pill twice a day for 5 days. Instructions are the same for husband. Counseled patient on medication side effects and to abstain from alcohol and intercourse while on the medication. Instructed to return to the office if symptoms do not resolve.  Encouraged to abstain until he has taken the medication.

## 2019-11-09 NOTE — Telephone Encounter (Signed)
Patient coming in for appointment today February 24 to see Harriett Sine.

## 2019-11-09 NOTE — Telephone Encounter (Signed)
Office visit is best, sometimes only think we have bacteria could be yeast.

## 2019-11-09 NOTE — Telephone Encounter (Signed)
Yes they both took medication.

## 2019-11-21 ENCOUNTER — Other Ambulatory Visit: Payer: Self-pay

## 2019-11-21 ENCOUNTER — Ambulatory Visit (INDEPENDENT_AMBULATORY_CARE_PROVIDER_SITE_OTHER): Payer: Self-pay | Admitting: Obstetrics & Gynecology

## 2019-11-21 ENCOUNTER — Encounter: Payer: Self-pay | Admitting: Obstetrics & Gynecology

## 2019-11-21 VITALS — BP 124/78

## 2019-11-21 DIAGNOSIS — R300 Dysuria: Secondary | ICD-10-CM

## 2019-11-21 DIAGNOSIS — Z113 Encounter for screening for infections with a predominantly sexual mode of transmission: Secondary | ICD-10-CM

## 2019-11-21 DIAGNOSIS — N898 Other specified noninflammatory disorders of vagina: Secondary | ICD-10-CM

## 2019-11-21 DIAGNOSIS — Z8619 Personal history of other infectious and parasitic diseases: Secondary | ICD-10-CM

## 2019-11-21 LAB — WET PREP FOR TRICH, YEAST, CLUE

## 2019-11-21 MED ORDER — CLINDAMYCIN HCL 300 MG PO CAPS: 300.0000 mg | ORAL_CAPSULE | Freq: Two times a day (BID) | ORAL | 0 refills | Status: AC

## 2019-11-21 MED ORDER — FLUCONAZOLE 150 MG PO TABS: 150.0000 mg | ORAL_TABLET | Freq: Every day | ORAL | 0 refills | Status: AC

## 2019-11-21 MED ORDER — SULFAMETHOXAZOLE-TRIMETHOPRIM 800-160 MG PO TABS: 1.0000 | ORAL_TABLET | Freq: Two times a day (BID) | ORAL | 0 refills | Status: AC

## 2019-11-21 NOTE — Progress Notes (Signed)
    Jennifer Montoya [birth year] [id number]        44 y.o.  [id number] Married  RP: Recurrence of vaginal discharge with pelvic discomfort  HPI: Husband was unfaithful.  Treated twice for Trichomonas since January 2021.  The second time the husband was treated as well.  Recurrence of yellow vaginal discharge with pelvic discomfort.  No fever.  Normal regular menstrual periods.  Sexually active with husband.  Not using contraception.  Urine and bowel movements normal.   OB History  Gravida Para Term Preterm AB Living  3 2     1 2   SAB TAB Ectopic Multiple Live Births      1        # Outcome Date GA Lbr Len/2nd Weight Sex Delivery Anes PTL Lv  3 Ectopic           2 Para           1 Para             Past medical history,surgical history, problem list, medications, allergies, family history and social history were all reviewed and documented in the EPIC chart.   Directed ROS with pertinent positives and negatives documented in the history of present illness/assessment and plan.  Exam:  Vitals:   11/21/19 1208  BP: 124/78   General appearance:  Normal  Abdomen: Normal  Gynecologic exam: Vulva normal.  Speculum:  Cervix/vagina normal.  Vaginal discharge present.  Wet prep done.  Gono-Chlam done.  Bimanual exam:  Uterus AV, mobile, NT, normal volume.  No adnexal mass, NT bilaterally.  Wet prep: Trichomonas present.  Clue cells present.   U/A: Yellow cloudy, proteins 1+, nitrites negative, white blood cells 20-40, red blood cells 0-2, many bacteria.  Pending urine culture.   Assessment/Plan:  44 y.o. 54   1. Vaginal discharge Trichomonas infection and BV present.  Full STI screen done.  Treated twice with Metronidazole.  Will treat with Clinda now and husband needs to be treated too.  Strict condom use. - WET PREP FOR TRICH, YEAST, CLUE  2. H/O trichomoniasis Trichomonas still present on Wet prep.  3. Screen for STD (sexually transmitted disease) Strict condom use strongly  recommended. - HIV antibody (with reflex) - RPR - Hepatitis C Antibody - Hepatitis B Surface AntiGEN - C. trachomatis/N. gonorrhoeae RNA  4. Dysuria Abnormal urine analysis.  Will treat with Bactrim DS.  Usage reviewed and prescription sent to pharmacy. - Urinalysis,Complete w/RFL Culture  Other orders - clindamycin (CLEOCIN) 300 MG capsule; Take 1 capsule (300 mg total) by mouth 2 (two) times daily for 14 days. - sulfamethoxazole-trimethoprim (BACTRIM DS) 800-160 MG tablet; Take 1 tablet by mouth 2 (two) times daily for 3 days. - fluconazole (DIFLUCAN) 150 MG tablet; Take 1 tablet (150 mg total) by mouth daily for 3 days.  [id number] MD, 12:21 PM 11/21/2019

## 2019-11-22 LAB — HEPATITIS B SURFACE ANTIGEN: Hepatitis B Surface Ag: NONREACTIVE

## 2019-11-22 LAB — C. TRACHOMATIS/N. GONORRHOEAE RNA
C. trachomatis RNA, TMA: NOT DETECTED
N. gonorrhoeae RNA, TMA: NOT DETECTED

## 2019-11-22 LAB — RPR: RPR Ser Ql: NONREACTIVE

## 2019-11-22 LAB — HEPATITIS C ANTIBODY
Hepatitis C Ab: NONREACTIVE
SIGNAL TO CUT-OFF: 0.02 (ref <1.00)

## 2019-11-22 LAB — HIV ANTIBODY (ROUTINE TESTING W REFLEX): HIV 1&2 Ab, 4th Generation: NONREACTIVE

## 2019-11-23 LAB — URINALYSIS, COMPLETE W/RFL CULTURE
Bilirubin Urine: NEGATIVE
Glucose, UA: NEGATIVE
Hyaline Cast: NONE SEEN /LPF
Nitrites, Initial: NEGATIVE
Specific Gravity, Urine: 1.025 (ref 1.001–1.03)
pH: 7.5 (ref 5.0–8.0)

## 2019-11-23 LAB — URINE CULTURE
MICRO NUMBER:: 10225488
Result:: NO GROWTH
SPECIMEN QUALITY:: ADEQUATE

## 2019-11-23 LAB — CULTURE INDICATED

## 2019-11-26 ENCOUNTER — Encounter: Payer: Self-pay | Admitting: Obstetrics & Gynecology

## 2019-11-26 NOTE — Patient Instructions (Signed)
1. Vaginal discharge Trichomonas infection and BV present.  Full STI screen done.  Treated twice with Metronidazole.  Will treat with Clinda now and husband needs to be treated too.  Strict condom use. - WET PREP FOR TRICH, YEAST, CLUE  2. H/O trichomoniasis Trichomonas still present on Wet prep.  3. Screen for STD (sexually transmitted disease) Strict condom use strongly recommended. - HIV antibody (with reflex) - RPR - Hepatitis C Antibody - Hepatitis B Surface AntiGEN - C. trachomatis/N. gonorrhoeae RNA  4. Dysuria Abnormal urine analysis.  Will treat with Bactrim DS.  Usage reviewed and prescription sent to pharmacy. - Urinalysis,Complete w/RFL Culture  Other orders - clindamycin (CLEOCIN) 300 MG capsule; Take 1 capsule (300 mg total) by mouth 2 (two) times daily for 14 days. - sulfamethoxazole-trimethoprim (BACTRIM DS) 800-160 MG tablet; Take 1 tablet by mouth 2 (two) times daily for 3 days. - fluconazole (DIFLUCAN) 150 MG tablet; Take 1 tablet (150 mg total) by mouth daily for 3 days.  Lindsee, it was a pleasure seeing you today!  I will inform you of your results as soon as they are available.

## 2019-12-08 ENCOUNTER — Encounter: Payer: Self-pay | Admitting: Obstetrics & Gynecology

## 2019-12-08 ENCOUNTER — Ambulatory Visit (INDEPENDENT_AMBULATORY_CARE_PROVIDER_SITE_OTHER): Payer: Self-pay | Admitting: Obstetrics & Gynecology

## 2019-12-08 ENCOUNTER — Other Ambulatory Visit: Payer: Self-pay

## 2019-12-08 VITALS — BP 120/84

## 2019-12-08 DIAGNOSIS — A599 Trichomoniasis, unspecified: Secondary | ICD-10-CM

## 2019-12-08 DIAGNOSIS — Z8619 Personal history of other infectious and parasitic diseases: Secondary | ICD-10-CM

## 2019-12-08 DIAGNOSIS — N898 Other specified noninflammatory disorders of vagina: Secondary | ICD-10-CM

## 2019-12-08 DIAGNOSIS — N760 Acute vaginitis: Secondary | ICD-10-CM

## 2019-12-08 DIAGNOSIS — B9689 Other specified bacterial agents as the cause of diseases classified elsewhere: Secondary | ICD-10-CM

## 2019-12-08 LAB — WET PREP FOR TRICH, YEAST, CLUE

## 2019-12-08 MED ORDER — METRONIDAZOLE 500 MG PO TABS: 500.0000 mg | ORAL_TABLET | Freq: Three times a day (TID) | ORAL | 0 refills | Status: AC

## 2019-12-08 NOTE — Progress Notes (Signed)
    Jennifer Montoya 01-20-1976 [id number]        43 y.o.  [id number] Married  RP: Recurrent vaginal discharge with odor and itching  HPI: Vaginal discharge increased again with odor and itching.  H/O Recurrent/persistent Trichomonas recently.  No pelvic pain.  No fever.  Urine/BMs normal.   OB History  Gravida Para Term Preterm AB Living  3 2     1 2   SAB TAB Ectopic Multiple Live Births      1        # Outcome Date GA Lbr Len/2nd Weight Sex Delivery Anes PTL Lv  3 Ectopic           2 Para           1 Para             Past medical history,surgical history, problem list, medications, allergies, family history and social history were all reviewed and documented in the EPIC chart.   Directed ROS with pertinent positives and negatives documented in the history of present illness/assessment and plan.  Exam:  Vitals:   12/08/19 1008  BP: 120/84   General appearance:  Normal  Abdomen: Normal  Gynecologic exam: Vulva normal.  Speculum:  Cervix/Vagina normal.  Increased vaginal discharge with bubbles.  Wet prep done.  Wet prep:  Trichomonas and Clue cells present   Assessment/Plan:  44 y.o. 54   1. Vaginal discharge Confirmed bacterial vaginosis and trichomonas again on wet prep.  Rest of STD screening was recently negative. - WET PREP FOR TRICH, YEAST, CLUE  2. Trichomoniasis Per patient, no sexual activity since last diagnosis of trichomoniasis when she was treated with clindamycin.  Decision therefore to treat with metronidazole 500 mg/tab., 1 tablet per mouth 3 times a day for 10 days.  Abstain from sexual activity.  Strict condom use recommended when he resumes sexual activity after test of cure.  3. Bacterial vaginosis Same treatment with metronidazole.  4. 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  Other orders - metroNIDAZOLE (FLAGYL) 500 MG tablet; Take 1 tablet (500 mg total) by mouth 3 (three) times daily for 10  days.  [id number] MD, 10:26 AM 12/08/2019

## 2019-12-09 ENCOUNTER — Encounter: Payer: Self-pay | Admitting: Obstetrics & Gynecology

## 2019-12-09 NOTE — Patient Instructions (Signed)
1. Vaginal discharge Confirmed bacterial vaginosis and trichomonas again on wet prep.  Rest of STD screening was recently negative. - WET PREP FOR TRICH, YEAST, CLUE  2. Trichomoniasis Per patient, no sexual activity since last diagnosis of trichomoniasis when she was treated with clindamycin.  Decision therefore to treat with metronidazole 500 mg/tab., 1 tablet per mouth 3 times a day for 10 days.  Abstain from sexual activity.  Strict condom use recommended when he resumes sexual activity after test of cure.  3. Bacterial vaginosis Same treatment with metronidazole.  4. 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  Other orders - metroNIDAZOLE (FLAGYL) 500 MG tablet; Take 1 tablet (500 mg total) by mouth 3 (three) times daily for 10 days.  Jennifer Montoya, it was a pleasure seeing you today!

## 2019-12-20 ENCOUNTER — Other Ambulatory Visit: Payer: Self-pay

## 2019-12-21 ENCOUNTER — Encounter: Payer: Self-pay | Admitting: Obstetrics & Gynecology

## 2019-12-21 ENCOUNTER — Ambulatory Visit (INDEPENDENT_AMBULATORY_CARE_PROVIDER_SITE_OTHER): Payer: Self-pay | Admitting: Obstetrics & Gynecology

## 2019-12-21 VITALS — BP 126/78

## 2019-12-21 DIAGNOSIS — Z8619 Personal history of other infectious and parasitic diseases: Secondary | ICD-10-CM

## 2019-12-21 LAB — WET PREP FOR TRICH, YEAST, CLUE

## 2019-12-21 NOTE — Progress Notes (Signed)
    Jennifer Montoya 1976-01-07 [id number]        44 y.o.  [id number]   RP: H/O Trichomonas for TOC  HPI:  Treated with Metronidazole 500 mg TID x 10 days starting on 12/08/2019.  No IC since then.  No vaginal discharge anymore.  No pelvic pain.  H/O Recurrent/persistent Trichomonas recently.  No fever.  Urine/BMs normal.   OB History  Gravida Para Term Preterm AB Living  3 2     1 2   SAB TAB Ectopic Multiple Live Births      1        # Outcome Date GA Lbr Len/2nd Weight Sex Delivery Anes PTL Lv  3 Ectopic           2 Para           1 Para             Past medical history,surgical history, problem list, medications, allergies, family history and social history were all reviewed and documented in the EPIC chart.   Directed ROS with pertinent positives and negatives documented in the history of present illness/assessment and plan.  Exam:  Vitals:   12/21/19 1406  BP: 126/78   General appearance:  Normal  Abdomen: Normal  Gynecologic exam: Vulva normal.  Speculum:  Cervix/vagina normal.  Secretions normal.  Wet prep done.  Wet prep: Negative   Assessment/Plan:  44 y.o. 54   1. History of trichomoniasis Asymptomatic x treatment with Metronidazole 500 mg TID x 10 days started 3/25th.  Wet prep today is completely negative.  Patient informed and reassured. - WET PREP FOR TRICH, YEAST, CLUE  4/25 MD, 2:09 PM 12/21/2019

## 2019-12-21 NOTE — Patient Instructions (Signed)
1. History of trichomoniasis Asymptomatic x treatment with Metronidazole 500 mg TID x 10 days started 3/25th.  Wet prep today is completely negative.  Patient informed and reassured. - WET PREP FOR TRICH, YEAST, CLUE  Kaytlynne, fue un placer verle hoy!

## 2020-10-01 ENCOUNTER — Encounter: Payer: Self-pay | Admitting: Nurse Practitioner

## 2022-05-01 ENCOUNTER — Encounter (HOSPITAL_COMMUNITY): Payer: Self-pay | Admitting: Emergency Medicine

## 2022-05-01 ENCOUNTER — Ambulatory Visit (HOSPITAL_COMMUNITY)
Admission: EM | Admit: 2022-05-01 | Discharge: 2022-05-01 | Disposition: A | Discharge status: Home / Self Care | Payer: Self-pay | Attending: Family Medicine | Admitting: Family Medicine

## 2022-05-01 DIAGNOSIS — M654 Radial styloid tenosynovitis [de Quervain]: Secondary | ICD-10-CM

## 2022-05-01 NOTE — Discharge Instructions (Signed)
Le vieron hoy por tendinitis en la mueca. Te he dado informacin sobre esto. Te recomiendo seguir con motrin/advil para el dolor y la hinchazn, adems de usar hielo. Debe comprar una frula en espiga para el pulgar y usarla durante el da si puede. Debe evitar actividades que causen dolor. Es posible que Radio broadcast assistant un seguimiento con un ortopedista si contina con Chief Technology Officer.  You were seen today for tendonitis of your wrist.  I have given you information on this.  I recommend you continue motrin/advil for pain and swelling, as well as using ice.  You should purchase a thumb spica splint and wear this during the day if you are able.  You should avoid activites that cause pain.  You may  need to follow up with an orthopedist if you continue with pain.

## 2022-05-01 NOTE — ED Triage Notes (Addendum)
Patient c/o RT wrist pain that started 2 weeks ago.   Patient denies fall or trauma.   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   Patient endorses increased pain with movement.   Patient has taken ibuprofen with some relief. Patient has used a wrist brace after coming home from work with worsening pain upon waking in the mornings.

## 2022-05-01 NOTE — ED Provider Notes (Signed)
[facility]    CSN: [id number] Arrival date & time: 05/01/22  1336      History   Chief Complaint Chief Complaint  Patient presents with   Wrist Pain    HPI Jennifer Montoya is a 46 y.o. female.   Patient is here for right wrist pain x 2 weeks.  She can move it, but painful with repetitive movements. Works at [employer], [employer], and will get more pain while doing this.  Has taken motrin bid, and does work, but then the pain comes back.  She feels the area is inflamed.   At the lateral aspect of the wrist, base of the thumb.   History reviewed. No pertinent past medical history.  Patient Active Problem List   Diagnosis Date Noted   Trichomonas infection 09/27/2019    History reviewed. No pertinent surgical history.  OB History     Gravida  3   Para  2   Term      Preterm      AB  1   Living  2      SAB      IAB      Ectopic  1   Multiple      Live Births               Home Medications    Prior to Admission medications   Medication Sig Start Date End Date Taking? Authorizing Provider  ibuprofen (ADVIL) 400 MG tablet Take 400 mg by mouth every 6 (six) hours as needed for mild pain.   Yes [provider]    Family History History reviewed. No pertinent family history.  Social History Social History   Tobacco Use   Smoking status: Never   Smokeless tobacco: Never  Vaping Use   Vaping Use: Never used  Substance Use Topics   Alcohol use: Yes   Drug use: Never     Allergies   Patient has no known allergies.   Review of Systems Review of Systems  Constitutional: Negative.   HENT: Negative.    Respiratory: Negative.    Cardiovascular: Negative.   Gastrointestinal: Negative.   Genitourinary: Negative.   Musculoskeletal:  Positive for joint swelling.     Physical Exam Triage Vital Signs ED Triage Vitals  Enc Vitals Group     BP 05/01/22 1356 134/80     Pulse Rate 05/01/22 1356 73     Resp 05/01/22  1356 12     Temp 05/01/22 1356 98.2 F (36.8 C)     Temp Source 05/01/22 1356 Oral     SpO2 05/01/22 1356 97 %     Weight --      Height --      Head Circumference --      Peak Flow --      Pain Score 05/01/22 1400 8     Pain Loc --      Pain Edu? --      Excl. in GC? --    No data found.  Updated Vital Signs BP 134/80 (BP Location: Right Arm)   Pulse 73   Temp 98.2 F (36.8 C) (Oral)   Resp 12   LMP  (LMP Unknown)   SpO2 97%   Visual Acuity Right Eye Distance:   Left Eye Distance:   Bilateral Distance:    Right Eye Near:   Left Eye Near:    Bilateral Near:     Physical Exam Musculoskeletal:  Comments: Slight swelling to the wrist at the distal radius;  TTP at the site;  Finkelstein positive  Neurological:     General: No focal deficit present.     Mental Status: She is alert.  Psychiatric:        Mood and Affect: Mood normal.      UC Treatments / Results  Labs (all labs ordered are listed, but only abnormal results are displayed) Labs Reviewed - No data to display  EKG   Radiology No results found.  Procedures Procedures (including critical care time)  Medications Ordered in UC Medications - No data to display  Initial Impression / Assessment and Plan / UC Course  I have reviewed the triage vital signs and the nursing notes.  Pertinent labs & imaging results that were available during my care of the patient were reviewed by me and considered in my medical decision making (see chart for details).    Final Clinical Impressions(s) / UC Diagnoses   Final diagnoses:  Tendinitis, de Quervain's     Discharge Instructions      Le vieron hoy por tendinitis en la mueca. Te he dado informacin sobre esto. Te recomiendo seguir con motrin/advil para el dolor y la hinchazn, adems de usar hielo. Debe comprar una frula en espiga para el pulgar y usarla durante el da si puede. Debe evitar actividades que causen dolor. Es posible que Radio broadcast assistant  un seguimiento con un ortopedista si contina con Chief Technology Officer.  You were seen today for tendonitis of your wrist.  I have given you information on this.  I recommend you continue motrin/advil for pain and swelling, as well as using ice.  You should purchase a thumb spica splint and wear this during the day if you are able.  You should avoid activites that cause pain.  You may  need to follow up with an orthopedist if you continue with pain.       ED Prescriptions   None    PDMP not reviewed this encounter.   Jannifer Franklin, MD 05/01/22 1429

## 2024-04-13 ENCOUNTER — Ambulatory Visit: Payer: Self-pay | Admitting: Physician Assistant

## 2024-04-13 ENCOUNTER — Other Ambulatory Visit (HOSPITAL_COMMUNITY)
Admission: RE | Admit: 2024-04-13 | Discharge: 2024-04-13 | Disposition: A | Discharge status: Home / Self Care | Payer: Self-pay | Source: Ambulatory Visit | Attending: Physician Assistant | Admitting: Physician Assistant

## 2024-04-13 ENCOUNTER — Encounter: Payer: Self-pay | Admitting: Physician Assistant

## 2024-04-13 VITALS — BP 119/69 | HR 82 | Ht 62.0 in | Wt 130.0 lb

## 2024-04-13 DIAGNOSIS — L299 Pruritus, unspecified: Secondary | ICD-10-CM

## 2024-04-13 DIAGNOSIS — B9689 Other specified bacterial agents as the cause of diseases classified elsewhere: Secondary | ICD-10-CM

## 2024-04-13 DIAGNOSIS — N898 Other specified noninflammatory disorders of vagina: Secondary | ICD-10-CM

## 2024-04-13 DIAGNOSIS — N760 Acute vaginitis: Secondary | ICD-10-CM

## 2024-04-13 MED ORDER — FLUCONAZOLE 150 MG PO TABS: 150.0000 mg | ORAL_TABLET | Freq: Every day | ORAL | 0 refills | Status: AC

## 2024-04-13 MED ORDER — METRONIDAZOLE 500 MG PO TABS: 500.0000 mg | ORAL_TABLET | Freq: Two times a day (BID) | ORAL | 0 refills | Status: AC

## 2024-04-13 NOTE — Patient Instructions (Signed)
 VISIT SUMMARY:  You came in today because of vaginal itching and discharge that you have been experiencing for the past two days. You mentioned that the itching worsens after cleaning and that Vagisil cream only provides temporary relief. You also noted a yellowish, thin discharge without any odor. You were concerned about the possibility of a sexually transmitted disease.  YOUR PLAN:  -ACUTE VAGINITIS WITH VAGINAL DISCHARGE AND PRURITUS: Acute vaginitis is an inflammation of the vagina that can cause itching and discharge. It can be caused by infections such as sexually transmitted infections (STIs) or yeast infections. You were prescribed metronidazole  to take twice a day for 7 days and a single dose of fluconazole .    Infeccin vaginal (vaginosis bacteriana): Qu debe saber Vaginal Infection (Bacterial Vaginosis): What to Know  La vaginosis bacteriana es una infeccin de la vagina. Ocurre cuando cambia el equilibrio de los grmenes (bacterias) normales de la vagina. Es frecuente [employer] de 15 a 44 aos. Si no se trata, puede aumentar el riesgo de contraer una infeccin de transmisin sexual (ITS). Si est embarazada, debe recibir tratamiento de inmediato. Esta infeccin puede hacer que el beb nazca antes de tiempo o tenga un bajo peso al nacer. Cules son las causas? Esto sucede cuando una cantidad excesiva de grmenes nocivos crece en la vagina. Se desconoce la causa exacta por la que esto ocurre. No puede contraer esta infeccin en los asientos de inodoros, en las sbanas, en las piscinas ni el contacto con los objetos de alrededor. Qu incrementa el riesgo? Tener una nueva pareja sexual o mltiples parejas sexuales, o tener relaciones sexuales sin proteccin. Hacerse duchas vaginales. Usar un dispositivo intrauterino (DIU). Fumar. Consumo de alcohol y drogas. Tomar ciertos antibiticos. Estar embarazada. Se puede contraer una infeccin vaginal sin tener actividad sexual.  Sin embargo, ocurre con ms frecuencia en las mujeres sexualmente [place]. Cules son los signos o sntomas? Algunas mujeres no tienen sntomas. Si tiene sntomas, estos pueden incluir los siguientes: Secrecin vaginal de color gris o blanco. Puede ser acuosa o espumosa. Olor a pescado, especialmente despus de Child psychotherapist sexuales o durante el perodo menstrual. Picazn en la vagina y alrededor de Acupuncturist. Ardor o dolor al [employer]. Cmo se diagnostica? Esta infeccin se diagnostica en funcin de lo siguiente: Sus antecedentes mdicos. Examen fsico de la vagina. [place] de lquido vaginal para detectar bacterias nocivas o clulas que no son las habituales. Cmo se trata? Esta afeccin se trata con antibiticos. Estos pueden administrarse de la siguiente forma: Pastillas. Crema para la vagina. Un medicamento que se coloca dentro de la vagina, llamado vulo vaginal. Si la infeccin reaparece, es posible que necesite ms antibiticos. Siga estas indicaciones en su casa: Medicamentos Use sus medicamentos nicamente segn las indicaciones. Tome o aplquese los antibiticos como se le indic. No deje de usarlos aunque comience a sentirse mejor. Indicaciones generales Si tiene una pareja sexual mujer, avsele que sufre la infeccin. Ella debera consultar al mdico. Las parejas masculinas no necesitan tratamiento. Evite las Marine scientist. Beba ms lquido segn se lo hayan indicado. Mantenga limpia la zona que rodea la vagina y Administrator. Lave la zona diariamente con agua tibia. Lmpiese de adelante hacia atrs despus de defecar. Si est amamantando, hable con su mdico acerca de Manufacturing engineer. Cmo se previene? Autocuidado No se haga duchas vaginales ni use desodorantes en aerosol para la vagina. Las duchas vaginales pueden alterar el equilibrio de bacterias buenas y  nocivas de la vagina, lo que puede  hacer que una infeccin vuelva a Radiation protection practitioner. Use ropa interior de algodn o con revestimiento de algodn. No use pantalones ajustados ni pantis; en especial, en el verano. Sexo seguro Use preservativos correctamente, cada vez que tenga relaciones sexuales. Use protectores bucales para protegerse durante el sexo oral. Limite el nmero de parejas sexuales que tiene. Hgase exmenes de ITS. Su pareja sexual tambin debera [employer]. Drogas y alcohol No fume, vapee ni consuma nicotina o tabaco. No consuma drogas. Limite la cantidad de alcohol que bebe porque puede conducir a que haga [employer]. Dnde obtener ms informacin Para obtener ms informacin: Visite TonerPromos.no. Sports administrator clic en Health Topics A-Z (Temas de salud de la A a la Z). Escriba "bacterial vaginosis" (vaginosis bacteriana) en el cuadro de bsqueda. American Sexual Health Association Designer, jewellery) (Asociacin Estadounidense de la Salud Sexual): ashasexualhealth.org U.S. Department of Health and [employer], Office on [employer] (Oficina de Salud de la Mujer del Departamento de Salud y Servicios Humanos de EE. UU.): TravelLesson.ca Comunquese con un mdico si: Los sntomas no mejoran incluso despus de Medical illustrator. Tiene ms secrecin o siente dolor al [employer]. Tiene fiebre o siente escalofros. Siente dolor en el abdomen o la pelvis. Siente dolor durante las [employer]. Tiene sangrado vaginal entre los periodos [employer]. Esta informacin no tiene Theme park manager el consejo del mdico. Asegrese de hacerle al mdico cualquier pregunta que tenga. Document Revised: 04/14/2023 Document Reviewed: 04/14/2023 Elsevier Patient Education  2024 [employer].

## 2024-04-13 NOTE — Progress Notes (Signed)
 New Patient Office Visit  Subjective    Patient ID: Jennifer Montoya, female    DOB: 1975/11/27  Age: 48 y.o. MRN: [id number]  CC:  Chief Complaint  Patient presents with   Vaginal Itching    Patient is experiencing vaginal discomfort, Itching and vaginal discharge. She has used Otc vagisil cream with no relief    Discussed the use of AI scribe software for clinical note transcription with the patient, who gave verbal consent to proceed.  History of Present Illness   Jennifer Montoya is a 48 year old female who presents with vaginal itching and discharge.  She has experienced vaginal itching for two days, which worsens after cleaning following discharge. Vagisil cream provides temporary relief, but itching recurs after cleaning. A yellowish, thin vaginal discharge is present for the same duration, without odor. She denies abdominal pain, fever, nausea, or vomiting. She is concerned about a sexually transmitted disease, however no known exposures.    Due to language barrier, an interpreter was present during the history-taking and subsequent discussion (and for part of the physical exam) with this patient.   Outpatient Encounter Medications as of 04/13/2024  Medication Sig   fluconazole  (DIFLUCAN ) 150 MG tablet Take 1 tablet (150 mg total) by mouth daily.   metroNIDAZOLE  (FLAGYL ) 500 MG tablet Take 1 tablet (500 mg total) by mouth 2 (two) times daily for 7 days.   ibuprofen (ADVIL) 400 MG tablet Take 400 mg by mouth every 6 (six) hours as needed for mild pain. (Patient not taking: Reported on 04/13/2024)   No facility-administered encounter medications on file as of 04/13/2024.    History reviewed. No pertinent past medical history.  History reviewed. No pertinent surgical history.  History reviewed. No pertinent family history.  Social History   Socioeconomic History   Marital status: Married    Spouse name: Not on file   Number of children: Not on file   Years of education:  Not on file   Highest education level: Not on file  Occupational History   Not on file  Tobacco Use   Smoking status: Never   Smokeless tobacco: Never  Vaping Use   Vaping status: Never Used  Substance and Sexual Activity   Alcohol use: Yes   Drug use: Never   Sexual activity: Yes    Birth control/protection: None  Other Topics Concern   Not on file  Social History Narrative   Not on file   Social Drivers of Health   Financial Resource Strain: Not on file  Food Insecurity: Not on file  Transportation Needs: Not on file  Physical Activity: Not on file  Stress: Not on file  Social Connections: Not on file  Intimate Partner Violence: Not on file    Review of Systems  Constitutional:  Negative for chills and fever.  HENT: Negative.    Eyes: Negative.   Respiratory:  Negative for shortness of breath.   Cardiovascular:  Negative for chest pain.  Gastrointestinal:  Negative for abdominal pain, nausea and vomiting.  Genitourinary:  Negative for dysuria, frequency and hematuria.  Musculoskeletal: Negative.   Skin: Negative.   Neurological: Negative.   Endo/Heme/Allergies: Negative.   Psychiatric/Behavioral: Negative.          Objective    BP 119/69 (BP Location: Left Arm, Patient Position: Sitting, Cuff Size: Normal)   Pulse 82   Ht 5' 2 (1.575 m)   Wt 130 lb (59 kg)   BMI 23.78 kg/m   Physical Exam Vitals  and nursing note reviewed.    GENERAL: Alert, cooperative, well developed, no acute distress HEENT: Normocephalic, normal oropharynx, moist mucous membranes CHEST: Clear to auscultation bilaterally, no wheezes, rhonchi, or crackles CARDIOVASCULAR: Regular rate and rhythm, S1 and S2 normal without murmurs EXTREMITIES: No cyanosis or edema NEUROLOGICAL: Cranial nerves grossly intact, moves all extremities without gross motor or sensory deficit    Assessment & Plan:   Problem List Items Addressed This Visit   None Visit Diagnoses       Bacterial  vaginal infection    -  Primary   Relevant Medications   metroNIDAZOLE  (FLAGYL ) 500 MG tablet   fluconazole  (DIFLUCAN ) 150 MG tablet   Other Relevant Orders   Cervicovaginal ancillary only     Vagina itching       Relevant Medications   fluconazole  (DIFLUCAN ) 150 MG tablet      Assessment and Plan Acute vaginitis with vaginal discharge and pruritus Acute vaginal itching and discharge. Differential includes STIs and yeast infection. Temporary relief with Vagisil. Declined HIV and syphilis screening. - Prescribed metronidazole  BID for 7 days. - Prescribed single dose of fluconazole . Patient education given on supportive care   I have reviewed the patient's medical history (PMH, PSH, Social History, Family History, Medications, and allergies) , and have been updated if relevant. I spent 20 minutes reviewing chart and  face to face time with patient.    Return if symptoms worsen or fail to improve.   Kirk RAMAN Mayers, PA-C

## 2024-04-14 ENCOUNTER — Ambulatory Visit: Payer: Self-pay | Admitting: Physician Assistant

## 2024-04-14 DIAGNOSIS — A749 Chlamydial infection, unspecified: Secondary | ICD-10-CM

## 2024-04-14 LAB — CERVICOVAGINAL ANCILLARY ONLY
Bacterial Vaginitis (gardnerella): NEGATIVE
Chlamydia: POSITIVE — AB
Comment: NEGATIVE
Comment: NEGATIVE
Comment: NEGATIVE
Comment: NORMAL
Neisseria Gonorrhea: NEGATIVE
Trichomonas: POSITIVE — AB

## 2024-04-14 MED ORDER — DOXYCYCLINE HYCLATE 100 MG PO CAPS: 100.0000 mg | ORAL_CAPSULE | Freq: Two times a day (BID) | ORAL | 0 refills | Status: AC

## 2024-04-15 NOTE — Telephone Encounter (Signed)
 Patient came  in the office to lab results. Patient identified. Aided by Fidela [phone]  Patient was given lab results and had no questions
